# Patient Record
Sex: Male | Born: 1970 | Race: White | Hispanic: No | Smoking: Never smoker
Health system: Southern US, Community
[De-identification: ages and names within clinical notes are randomized; demographics above are authoritative.]

## PROBLEM LIST (undated history)

## (undated) DIAGNOSIS — B001 Herpesviral vesicular dermatitis: Secondary | ICD-10-CM

## (undated) DIAGNOSIS — Z6841 Body Mass Index (BMI) 40.0 and over, adult: Principal | ICD-10-CM

## (undated) HISTORY — DX: Herpesviral vesicular dermatitis: B00.1

## (undated) HISTORY — DX: Body Mass Index (BMI) 40.0 and over, adult: Z684

## (undated) HISTORY — DX: Morbid (severe) obesity due to excess calories: E66.01

---

## 2008-04-01 ENCOUNTER — Emergency Department: Payer: Self-pay | Admitting: Internal Medicine

## 2015-06-05 ENCOUNTER — Ambulatory Visit (INDEPENDENT_AMBULATORY_CARE_PROVIDER_SITE_OTHER): Payer: Managed Care, Other (non HMO) | Admitting: Primary Care

## 2015-06-05 ENCOUNTER — Encounter: Payer: Self-pay | Admitting: Primary Care

## 2015-06-05 VITALS — BP 122/72 | HR 87 | Temp 97.7°F | Ht 73.5 in | Wt 387.4 lb

## 2015-06-05 DIAGNOSIS — Z23 Encounter for immunization: Secondary | ICD-10-CM | POA: Diagnosis not present

## 2015-06-05 DIAGNOSIS — B001 Herpesviral vesicular dermatitis: Secondary | ICD-10-CM | POA: Diagnosis not present

## 2015-06-05 DIAGNOSIS — Z6841 Body Mass Index (BMI) 40.0 and over, adult: Secondary | ICD-10-CM | POA: Diagnosis not present

## 2015-06-05 MED ORDER — VALACYCLOVIR HCL 1 G PO TABS: ORAL_TABLET | ORAL | Status: DC

## 2015-06-05 NOTE — Assessment & Plan Note (Signed)
Present entire life. Recently breakouts have been more frequent (3 episodes in 8 weeks). No improvement with OTC treatment. RX for valtrex sent to pharmacy for outbreaks. Discussed instructions and how to use.

## 2015-06-05 NOTE — Assessment & Plan Note (Addendum)
Struggled with obesity since 2002. Once managed at a bariatric clinic in [place] as he was pursuing surgery. Never went through with surgery as he changed his mind. He is interested now and would like to meet with someone to discuss options. Referral made to bariatric surgical center. Will have him schedule complete physical soon at his convenience.   Discussed the importance of a healthy diet and regular exercise in order for weight loss and to reduce risk of other medical diseases.

## 2015-06-05 NOTE — Progress Notes (Signed)
 Subjective:    Patient ID: Brian Mullins, male    DOB: 05/29/1971, 44 y.o.   MRN: [id number]  HPI  Brian Mullins is a 44 year old male who presents today to establish care and discuss the problems mentioned below. Will obtain old records. Last physical was several years.   1) Foot Pain: Located to the right lateral foot. He describes his pain as a sharp, dull, ache. His pain has been bothering him intermittently for several years, with his most recent episode starting 3 weeks ago. He denies discoloration of skin. His pain is most bothersome when playing golf 3 times weekly, especially when coming through with his swing. He tries to wear comfortable shoes.   2) Cold Sores: Present his entire life. Breakouts once occuring once every seasonal change, but recently has had 3 cold sore breakouts in the past 8 weeks. He will apply peroxide to keep them dry, and has tried Abreva. His cold sores will last about 2 weeks. The Abreva has not helped to reduce the duration of his cold sores.  3) Obesity: Struggled with obesity since 2002. He is interested in gastric bypass surgery. He lived in [place] for several years and was set up with a bariatric center to start the process. He participated in a program to lose weight on his own for 5 months. He took several classes, met with a dietician regularly, and had monthly weigh in's. He did this for 5 consecutive months. He lost 30 pounds through this program. He then changed his mind regarding bariatric surgery and has since moved to [place]. He is now interested in perusing bariatric surgery and would like to meet with someone to discuss his options. He's not had a routine physical in years.  Body mass index is 50.41 kg/(m^2).   Review of Systems  Constitutional: Negative for unexpected weight change.  HENT: Negative for rhinorrhea.   Respiratory: Negative for cough and shortness of breath.   Cardiovascular: Negative for chest pain.  Gastrointestinal: Negative  for diarrhea and constipation.  Genitourinary: Negative for difficulty urinating.  Musculoskeletal:       Chronic knee pain, right- injury in high school. Also right foot pain.  Skin: Negative for rash.  Allergic/Immunologic: Positive for environmental allergies.  Neurological: Negative for dizziness, numbness and headaches.  Psychiatric/Behavioral:       Denies concerns for anxiety or depression       History reviewed. No pertinent past medical history.  Social History   Social History  . Marital Status: Divorced    Spouse Name: N/A  . Number of Children: N/A  . Years of Education: N/A   Occupational History  . Not on file.   Social History Main Topics  . Smoking status: Never Smoker   . Smokeless tobacco: Not on file  . Alcohol Use: 0.0 oz/week    0 Standard drinks or equivalent per week     Comment: social  . Drug Use: Not on file  . Sexual Activity: Not on file   Other Topics Concern  . Not on file   Social History Narrative   Divorced.   2 children.    Works as an executive.   Enjoys golfing, spending time with family.      History reviewed. No pertinent past surgical history.  History reviewed. No pertinent family history.  No Known Allergies  No current outpatient prescriptions on file prior to visit.   No current facility-administered medications on file prior to visit.      BP 122/72 mmHg  Pulse 87  Temp(Src) 97.7 F (36.5 C) (Oral)  Ht 6' 1.5" (1.867 m)  Wt 387 lb 6.4 oz (175.723 kg)  BMI 50.41 kg/m2  SpO2 98%    Objective:   Physical Exam  Constitutional: He is oriented to person, place, and time. He appears well-nourished.  HENT:  Mouth/Throat: Oropharynx is clear and moist.  Neck: Neck supple.  Cardiovascular: Normal rate and regular rhythm.   Pulmonary/Chest: Effort normal and breath sounds normal.  Neurological: He is alert and oriented to person, place, and time.  Skin: Skin is warm and dry.  Psychiatric: He has a normal mood  and affect.          Assessment & Plan:  Foot pain:  Located to right lateral side of foot.  Obvious callusing present to right lateral side from shoe. Could be plantar fasciitis, instructions provided for home treatment. Also discussed weight loss to reduce load of weight on foot, as well as comfortable shoes. Will continue to monitor.

## 2015-06-05 NOTE — Progress Notes (Signed)
Pre visit review using our clinic review tool, if applicable. No additional management support is needed unless otherwise documented below in the visit note. 

## 2015-06-05 NOTE — Addendum Note (Signed)
Addended by: Tawnya CrookSAMBATH, Nazim Kadlec on: 06/05/2015 09:44 AM   Modules accepted: Orders

## 2015-06-05 NOTE — Patient Instructions (Signed)
Cold Sores: I've sent in Valacyclovir tablets for recurrent cold sores. When you experience a breakout: Take 2 tablets by mouth every 12 hours for 1 day.  You will be contacted regarding your referral to the [facility] Center.  Please let us know if you have not heard back within one week.   Please schedule a physical with me within the next 3 months. You may also schedule a lab only appointment 3-4 days prior. We will discuss your lab results in detail during your physical.  It was a pleasure to meet you today! Please don't hesitate to call me with any questions. Welcome to [facility]!  Plantar Fasciitis Plantar fasciitis is a painful foot condition that affects the heel. It occurs when the band of tissue that connects the toes to the heel bone (plantar fascia) becomes irritated. This can happen after exercising too much or doing other repetitive activities (overuse injury). The pain from plantar fasciitis can range from mild irritation to severe pain that makes it difficult for you to walk or move. The pain is usually worse in the morning or after you have been sitting or lying down for a while. CAUSES This condition may be caused by:  Standing for long periods of time.  Wearing shoes that do not fit.  Doing high-impact activities, including running, aerobics, and ballet.  Being overweight.  Having an abnormal way of walking (gait).  Having tight calf muscles.  Having high arches in your feet.  Starting a new athletic activity. SYMPTOMS The main symptom of this condition is heel pain. Other symptoms include:  Pain that gets worse after activity or exercise.  Pain that is worse in the morning or after resting.  Pain that goes away after you walk for a few minutes. DIAGNOSIS This condition may be diagnosed based on your signs and symptoms. Your health care provider will also do a physical exam to check for:  A tender area on the bottom of your foot.  A high arch in your  foot.  Pain when you move your foot.  Difficulty moving your foot. You may also need to have imaging studies to confirm the diagnosis. These can include:  X-rays.  Ultrasound.  MRI. TREATMENT  Treatment for plantar fasciitis depends on the severity of the condition. Your treatment may include:  Rest, ice, and over-the-counter pain medicines to manage your pain.  Exercises to stretch your calves and your plantar fascia.  A splint that holds your foot in a stretched, upward position while you sleep (night splint).  Physical therapy to relieve symptoms and prevent problems in the future.  Cortisone injections to relieve severe pain.  Extracorporeal shock wave therapy (ESWT) to stimulate damaged plantar fascia with electrical impulses. It is often used as a last resort before surgery.  Surgery, if other treatments have not worked after 12 months. HOME CARE INSTRUCTIONS  Take medicines only as directed by your health care provider.  Avoid activities that cause pain.  Roll the bottom of your foot over a bag of ice or a bottle of cold water. Do this for 20 minutes, 3-4 times a day.  Perform simple stretches as directed by your health care provider.  Try wearing athletic shoes with air-sole or gel-sole cushions or soft shoe inserts.  Wear a night splint while sleeping, if directed by your health care provider.  Keep all follow-up appointments with your health care provider. PREVENTION   Do not perform exercises or activities that cause heel pain.  Consider  finding low-impact activities if you continue to have problems.  Lose weight if you need to. The best way to prevent plantar fasciitis is to avoid the activities that aggravate your plantar fascia. SEEK MEDICAL CARE IF:  Your symptoms do not go away after treatment with home care measures.  Your pain gets worse.  Your pain affects your ability to move or do your daily activities.   This information is not intended  to replace advice given to you by your health care provider. Make sure you discuss any questions you have with your health care provider.   Document Released: 02/25/2001 Document Revised: 02/21/2015 Document Reviewed: 04/12/2014 Elsevier Interactive Patient Education [employer]2016 Elsevier Inc.

## 2015-07-05 ENCOUNTER — Other Ambulatory Visit: Payer: Self-pay | Admitting: Primary Care

## 2015-07-05 DIAGNOSIS — Z0000 Encounter for general adult medical examination without abnormal findings: Secondary | ICD-10-CM

## 2015-07-11 ENCOUNTER — Other Ambulatory Visit: Payer: Managed Care, Other (non HMO)

## 2015-07-16 ENCOUNTER — Encounter: Payer: Managed Care, Other (non HMO) | Admitting: Primary Care

## 2015-07-16 ENCOUNTER — Telehealth: Payer: Self-pay | Admitting: Primary Care

## 2015-07-16 NOTE — Telephone Encounter (Signed)
Yes, please reschedule at his convenience. He will need to schedule labs as well. Please do not charge no show fee. Thanks!

## 2015-07-16 NOTE — Telephone Encounter (Signed)
Pt did not come in for their appt today for cpe. Please let me know if pt needs to be contacted immediately for follow up or no follow up needed. Best phone number to contact pt is [phone].

## 2015-07-17 ENCOUNTER — Telehealth: Payer: Self-pay

## 2015-07-17 NOTE — Telephone Encounter (Signed)
Pt left v/m; pt is out of town and request few pills to last pt until returns home on 07/19/15. Left v/m requesting pt to cb. Need to know name of med, quantity needed and pharmacy to send med to.

## 2015-07-23 NOTE — Telephone Encounter (Signed)
Left voicemail for patient to call back and reschedule appt. °

## 2015-08-15 NOTE — Telephone Encounter (Signed)
Unable to reach pt by phone letter mailed.  

## 2015-11-02 ENCOUNTER — Ambulatory Visit: Payer: Managed Care, Other (non HMO) | Admitting: Primary Care

## 2015-11-05 ENCOUNTER — Ambulatory Visit (INDEPENDENT_AMBULATORY_CARE_PROVIDER_SITE_OTHER): Payer: Managed Care, Other (non HMO) | Admitting: Primary Care

## 2015-11-05 ENCOUNTER — Encounter: Payer: Self-pay | Admitting: Primary Care

## 2015-11-05 ENCOUNTER — Ambulatory Visit (INDEPENDENT_AMBULATORY_CARE_PROVIDER_SITE_OTHER)
Admission: RE | Admit: 2015-11-05 | Discharge: 2015-11-05 | Disposition: A | Discharge status: Home / Self Care | Payer: Managed Care, Other (non HMO) | Source: Ambulatory Visit | Attending: Primary Care | Admitting: Primary Care

## 2015-11-05 VITALS — BP 122/82 | HR 87 | Temp 98.1°F | Ht 73.5 in | Wt 380.4 lb

## 2015-11-05 DIAGNOSIS — M25561 Pain in right knee: Secondary | ICD-10-CM | POA: Diagnosis not present

## 2015-11-05 DIAGNOSIS — Z6841 Body Mass Index (BMI) 40.0 and over, adult: Secondary | ICD-10-CM

## 2015-11-05 NOTE — Assessment & Plan Note (Signed)
Chronic for years, worse over past 2 months. Discussed various causes and treatment. His is likely cause by deterioration of cartilage, however, given location of pain to left medical collateral ligament, it is reasonable to examine with xray, and perhaps MRI. Will consider PT if necessary.  Discussed importance of weight loss to reduce pain. Ice, obtain knee brace, continue temporary Ibuprofen PRN.

## 2015-11-05 NOTE — Progress Notes (Signed)
Pre visit review using our clinic review tool, if applicable. No additional management support is needed unless otherwise documented below in the visit note. 

## 2015-11-05 NOTE — Progress Notes (Signed)
   Subjective:    Patient ID: Brian Mullins, male    DOB: [birth year], 45 y.o.   MRN: [id number]  HPI  Mr. Brian Mullins is a 45 year old male who presents today with a chief complaint of knee pain. His pain is located to the right knee on over the medial collateral ligament. His knee pain is chronic from an old injury, but over the past 2 months his pain has become more bothersome.   His pain is worse after working out if he works out in the morning. If he does not work out then his pain will develop around noon. He's been taking Advil 1200-1600 mg daily with improvement in discomfort.    He's undergone evaluation numerous years ago and was told he had damage to the \ cartildege in the knee. He's not undergone imaging recently. Denies feelings of "giving way", recent trauma, numbness/tinlging.  He's not tried icing his knee or wearing a knee brace. He was went through PT 15 years ago and believes it helped.     Review of Systems  Cardiovascular: Negative for leg swelling.  Musculoskeletal:       Right knee pain  Skin: Negative for color change.  Neurological: Negative for numbness.       Past Medical History  Diagnosis Date  . Morbid obesity with BMI of 50.0-59.9, adult (HCC)   . Recurrent cold sores      Social History   Social History  . Marital Status: Divorced    Spouse Name: N/A  . Number of Children: N/A  . Years of Education: N/A   Occupational History  . Not on file.   Social History Main Topics  . Smoking status: Never Smoker   . Smokeless tobacco: Not on file  . Alcohol Use: 0.0 oz/week    0 Standard drinks or equivalent per week     Comment: social  . Drug Use: Not on file  . Sexual Activity: Not on file   Other Topics Concern  . Not on file   Social History Narrative   Divorced.   2 children.    Works as an Psychologist, educationalexecutive.   Enjoys golfing, spending time with family.      No past surgical history on file.  No family history on file.  No Known  Allergies  Current Outpatient Prescriptions on File Prior to Visit  Medication Sig Dispense Refill  . valACYclovir (VALTREX) 1000 MG tablet Take 2 tablets by mouth twice daily for 1 day for cold sore breakouts. 30 tablet 0   No current facility-administered medications on file prior to visit.    BP 122/82 mmHg  Pulse 87  Temp(Src) 98.1 F (36.7 C) (Oral)  Ht 6' 1.5" (1.867 m)  Wt 380 lb 6.4 oz (172.548 kg)  BMI 49.50 kg/m2  SpO2 97%    Objective:   Physical Exam  Constitutional: He appears well-nourished.  Cardiovascular: Normal rate and regular rhythm.   Pulmonary/Chest: Effort normal and breath sounds normal.  Musculoskeletal:       Right knee: He exhibits decreased range of motion. He exhibits no swelling, no deformity and normal alignment. No medial joint line tenderness noted.          Assessment & Plan:

## 2015-11-05 NOTE — Patient Instructions (Signed)
Complete xray(s) prior to leaving today. I will notify you of your results once received.  Continue Advil as needed for pain and inflammation. Do not exceed 2400 mg of Advil in 24 hours.  Ice your knee three times daily for 20 minute intervals.   Apply a knee sleeve for support during the day.  I'll be in touch soon.  It was a pleasure to see you today!  Knee Pain Knee pain is a very common symptom and can have many causes. Knee pain often goes away when you follow your health care provider's instructions for relieving pain and discomfort at home. However, knee pain can develop into a condition that needs treatment. Some conditions may include:  Arthritis caused by wear and tear (osteoarthritis).  Arthritis caused by swelling and irritation (rheumatoid arthritis or gout).  A cyst or growth in your knee.  An infection in your knee joint.  An injury that will not heal.  Damage, swelling, or irritation of the tissues that support your knee (torn ligaments or tendinitis). If your knee pain continues, additional tests may be ordered to diagnose your condition. Tests may include X-rays or other imaging studies of your knee. You may also need to have fluid removed from your knee. Treatment for ongoing knee pain depends on the cause, but treatment may include:  Medicines to relieve pain or swelling.  Steroid injections in your knee.  Physical therapy.  Surgery. HOME CARE INSTRUCTIONS  Take medicines only as directed by your health care provider.  Rest your knee and keep it raised (elevated) while you are resting.  Do not do things that cause or worsen pain.  Avoid high-impact activities or exercises, such as running, jumping rope, or doing jumping jacks.  Apply ice to the knee area:  Put ice in a plastic bag.  Place a towel between your skin and the bag.  Leave the ice on for 20 minutes, 2-3 times a day.  Ask your health care provider if you should wear an elastic knee  support.  Keep a pillow under your knee when you sleep.  Lose weight if you are overweight. Extra weight can put pressure on your knee.  Do not use any tobacco products, including cigarettes, chewing tobacco, or electronic cigarettes. If you need help quitting, ask your health care provider. Smoking may slow the healing of any bone and joint problems that you may have. SEEK MEDICAL CARE IF:  Your knee pain continues, changes, or gets worse.  You have a fever along with knee pain.  Your knee buckles or locks up.  Your knee becomes more swollen. SEEK IMMEDIATE MEDICAL CARE IF:   Your knee joint feels hot to the touch.  You have chest pain or trouble breathing.   This information is not intended to replace advice given to you by your health care provider. Make sure you discuss any questions you have with your health care provider.   Document Released: 03/30/2007 Document Revised: 06/23/2014 Document Reviewed: 01/16/2014 Elsevier Interactive Patient Education [employer]2016 Elsevier Inc.

## 2015-11-05 NOTE — Assessment & Plan Note (Signed)
Has tried working out, but now has knee pain. He is interested in a referral to nutritionist for more education regarding weight loss. Referral placed.

## 2015-11-06 ENCOUNTER — Other Ambulatory Visit: Payer: Self-pay | Admitting: Primary Care

## 2015-11-06 DIAGNOSIS — M25561 Pain in right knee: Secondary | ICD-10-CM

## 2015-11-28 ENCOUNTER — Other Ambulatory Visit: Payer: Self-pay | Admitting: Primary Care

## 2015-11-28 DIAGNOSIS — B001 Herpesviral vesicular dermatitis: Secondary | ICD-10-CM

## 2015-11-28 NOTE — Telephone Encounter (Signed)
Electronically refill request for   valACYclovir (VALTREX) 1000 MG tablet   Take 2 tablets by mouth twice daily for 1 day for cold sore breakouts.  Dispense: 30 tablet   Refills: 0     Last prescribed on 06/05/2015. Last seen on 11/05/2015. No future appt.

## 2015-12-12 ENCOUNTER — Ambulatory Visit: Payer: Managed Care, Other (non HMO) | Admitting: Dietician

## 2015-12-25 ENCOUNTER — Telehealth: Payer: Self-pay

## 2015-12-25 DIAGNOSIS — B001 Herpesviral vesicular dermatitis: Secondary | ICD-10-CM

## 2015-12-25 MED ORDER — VALACYCLOVIR HCL 500 MG PO TABS: 500.0000 mg | ORAL_TABLET | Freq: Every day | ORAL | Status: DC

## 2015-12-25 NOTE — Telephone Encounter (Signed)
Please notify patient that he can take Valtrex 500 mg once daily every day for prevention of cold sores. I have sent a new prescription to his pharmacy. Please have him notify me if this does or does not help to prevent cold sore breakouts. If this helps please have him notify me and I will send a years worth of refills.

## 2015-12-25 NOTE — Telephone Encounter (Signed)
Pt left v/m; pt has had outbreak of cold sores x 3 in 3 months; pt has valtrex 1000 mg; when pt doubles the med it seems to help some but does not eliminate cold sores quickly pt wants stronger med that can take daily to prevent outbreaks of cold sores. Pt request cb. Pt established care12/20/16. Pt request cb.[facility]. No future appt scheduled.

## 2015-12-25 NOTE — Telephone Encounter (Signed)
Spoken and notified patient of Kate's comments. Patient verbalized understanding. 

## 2016-04-08 ENCOUNTER — Other Ambulatory Visit: Payer: Self-pay | Admitting: Primary Care

## 2016-04-08 DIAGNOSIS — B001 Herpesviral vesicular dermatitis: Secondary | ICD-10-CM

## 2016-07-02 ENCOUNTER — Other Ambulatory Visit: Payer: Self-pay | Admitting: Primary Care

## 2016-07-02 DIAGNOSIS — B001 Herpesviral vesicular dermatitis: Secondary | ICD-10-CM

## 2016-07-04 NOTE — Telephone Encounter (Signed)
Ok to refill? Electronically refill request for   valACYclovir (VALTREX) 500 MG tablet  Last prescribed on 04/09/2016. Last seen on 11/05/2015.

## 2016-08-19 ENCOUNTER — Telehealth: Payer: Self-pay

## 2016-08-19 NOTE — Telephone Encounter (Signed)
Spoken and notified patient of Kate's comments. Patient verbalized understanding. 

## 2016-08-19 NOTE — Telephone Encounter (Signed)
Pt started on 08/18/16 with prod cough with clear to green phlegm; congestion, H/A and chills; no fever yet.pt [facility].

## 2016-08-19 NOTE — Telephone Encounter (Signed)
Please notify patient that we are sorry he's not feeling well. These symptoms are representative of a viral illness, but doesn't mean that he has the flu. We are happy to see him in the clinic for testing if he chooses, otherwise he can try a few things over the counter. Tamiflu has lots of side effects, so if it's not the flu then he could potentially feel worse.  Cough/Congestion: Try taking Mucinex DM. This will help loosen up the mucous in your chest. Ensure you take this medication with a full glass of water.  Headache/Chills: Ibuprofen 600 mg three times daily or Tylenol 650 mg three times daily.  Nasal Congestion/Ear Pressure/Sinus Pressure: Try using Flonase (fluticasone) nasal spray. Instill 1 spray in each nostril twice daily.

## 2016-10-06 ENCOUNTER — Other Ambulatory Visit: Payer: Self-pay

## 2016-10-06 DIAGNOSIS — B001 Herpesviral vesicular dermatitis: Secondary | ICD-10-CM

## 2016-10-06 NOTE — Telephone Encounter (Signed)
How long has he taken the valtrex daily? 6+ months? Shorter? Longer? Has he tried stopping the daily treatment to see if cold sores return. Generally we treat for about 6 months then stop to see if the cold sores stay controlled.  Let me know. Also due for office visit in May if we are to continue filling his medications. Would like to do CPE.

## 2016-10-06 NOTE — Telephone Encounter (Signed)
Pt left v/m requesting refill valacyclovir to [facility]. Last refilled # 90 on 07/04/16. Pt takes med to prevent cold sores on mouth. Pt last seen [facility] 11/05/15.Please advise.

## 2016-10-07 ENCOUNTER — Other Ambulatory Visit: Payer: Self-pay | Admitting: Primary Care

## 2016-10-07 DIAGNOSIS — B001 Herpesviral vesicular dermatitis: Secondary | ICD-10-CM

## 2016-10-07 NOTE — Telephone Encounter (Signed)
Ok to refill? Electronically refill request for valACYclovir (VALTREX) 500 MG tablet. Last prescribed on 07/04/2016. Last seen on 11/05/2015  Patient is taking Valtrex 500 mg once daily every day for prevention of cold sores.

## 2016-10-07 NOTE — Telephone Encounter (Signed)
How long has he been taking this for cold sore prevention? Typically it's not meant to be taken daily for more than 3-6 months at a time. If he's been taking this for that long of time then I recommend we trial him off of daily medication to see if his cold sores are well controlled.

## 2016-10-08 MED ORDER — VALACYCLOVIR HCL 500 MG PO TABS: 500.0000 mg | ORAL_TABLET | Freq: Every day | ORAL | 0 refills | Status: DC

## 2016-10-08 NOTE — Telephone Encounter (Signed)
Spoke with patient. Overall cold sores are mostly controlled on the daily Valtrex dose, he has experienced 3 outbreaks in three months on Valtrex. Discussed that safety and efficacy has not been done on long term daily doses of valtrex greater than 1 year. Will extend his prescription for another three months and check back in at that point. He agreed and verbalized understanding.

## 2016-10-08 NOTE — Telephone Encounter (Signed)
Pt left v/m; pt has been taking valacyclovir for 6 months; pt has cold sores on and off and pt thinks due to stress. Pt request cb with what to do next.

## 2016-10-08 NOTE — Telephone Encounter (Signed)
Per DPR, left detail message of Kate's comments for patient to call back. 

## 2016-10-09 NOTE — Telephone Encounter (Signed)
This was taken call on the previous telephone note.

## 2017-01-23 IMAGING — DX DG KNEE AP/LAT W/ SUNRISE*R*
4 series · 4 of 4 positions shown · non-contrast
Comparison: None.

CLINICAL DATA: Morbid obesity. Chronic right knee pain worse over
the last 2 months.

EXAM:
RIGHT KNEE 3 VIEWS

[knee ap]
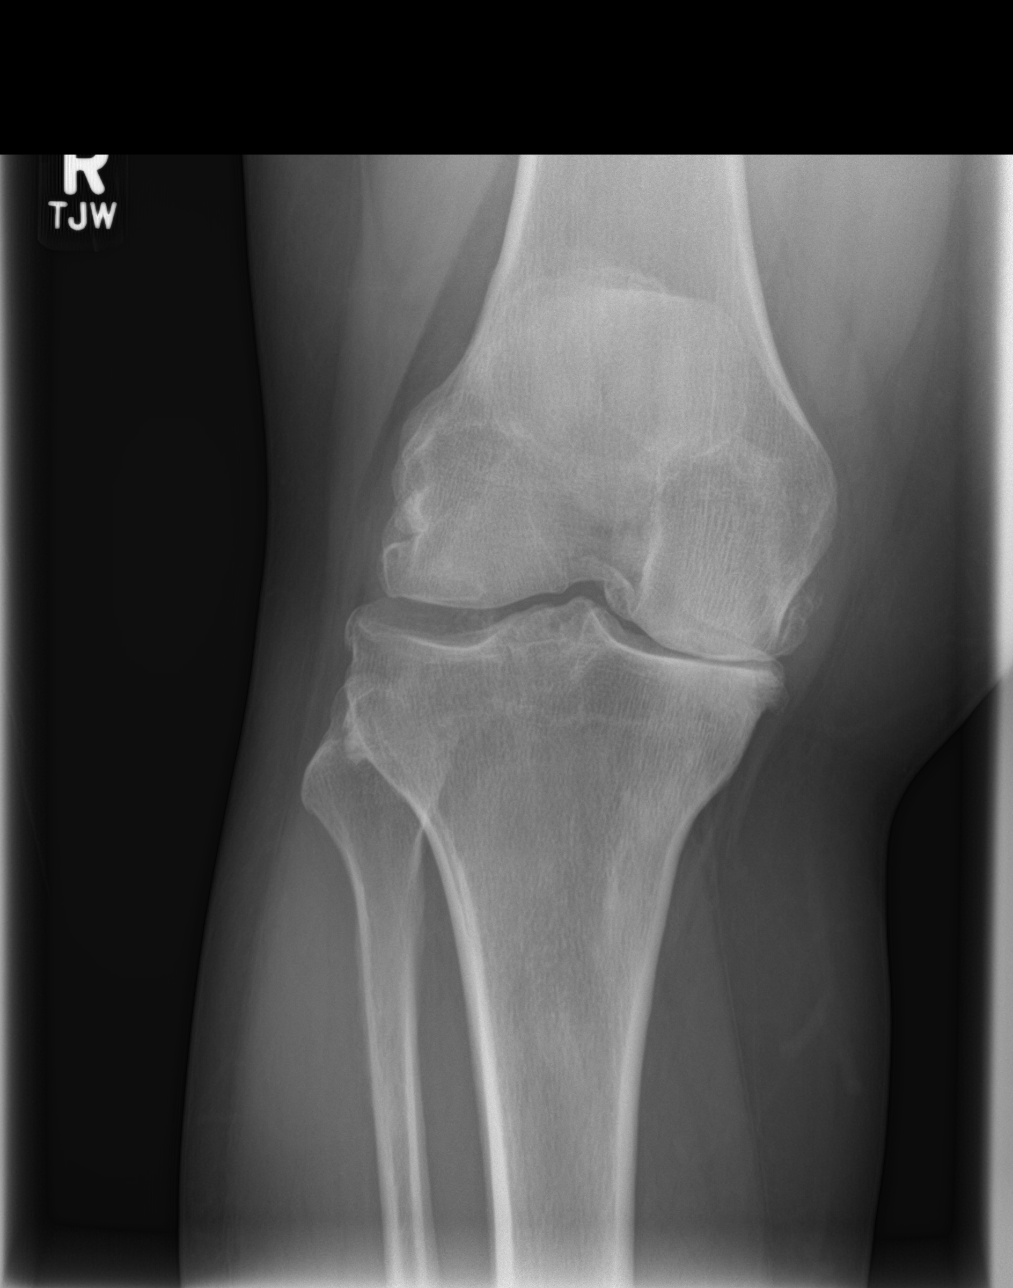

[knee lat]
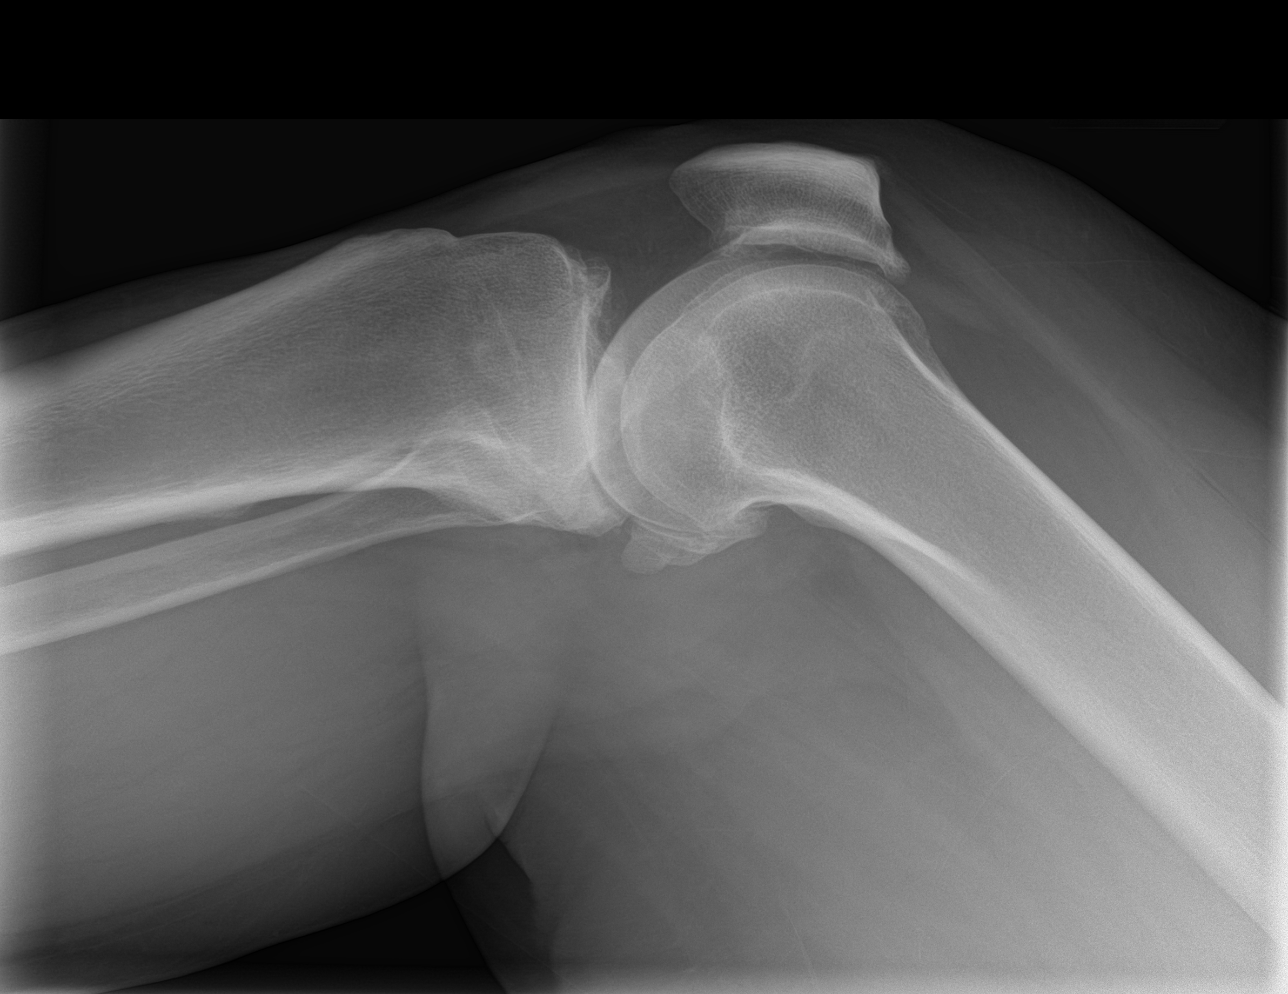

[patella skyline]
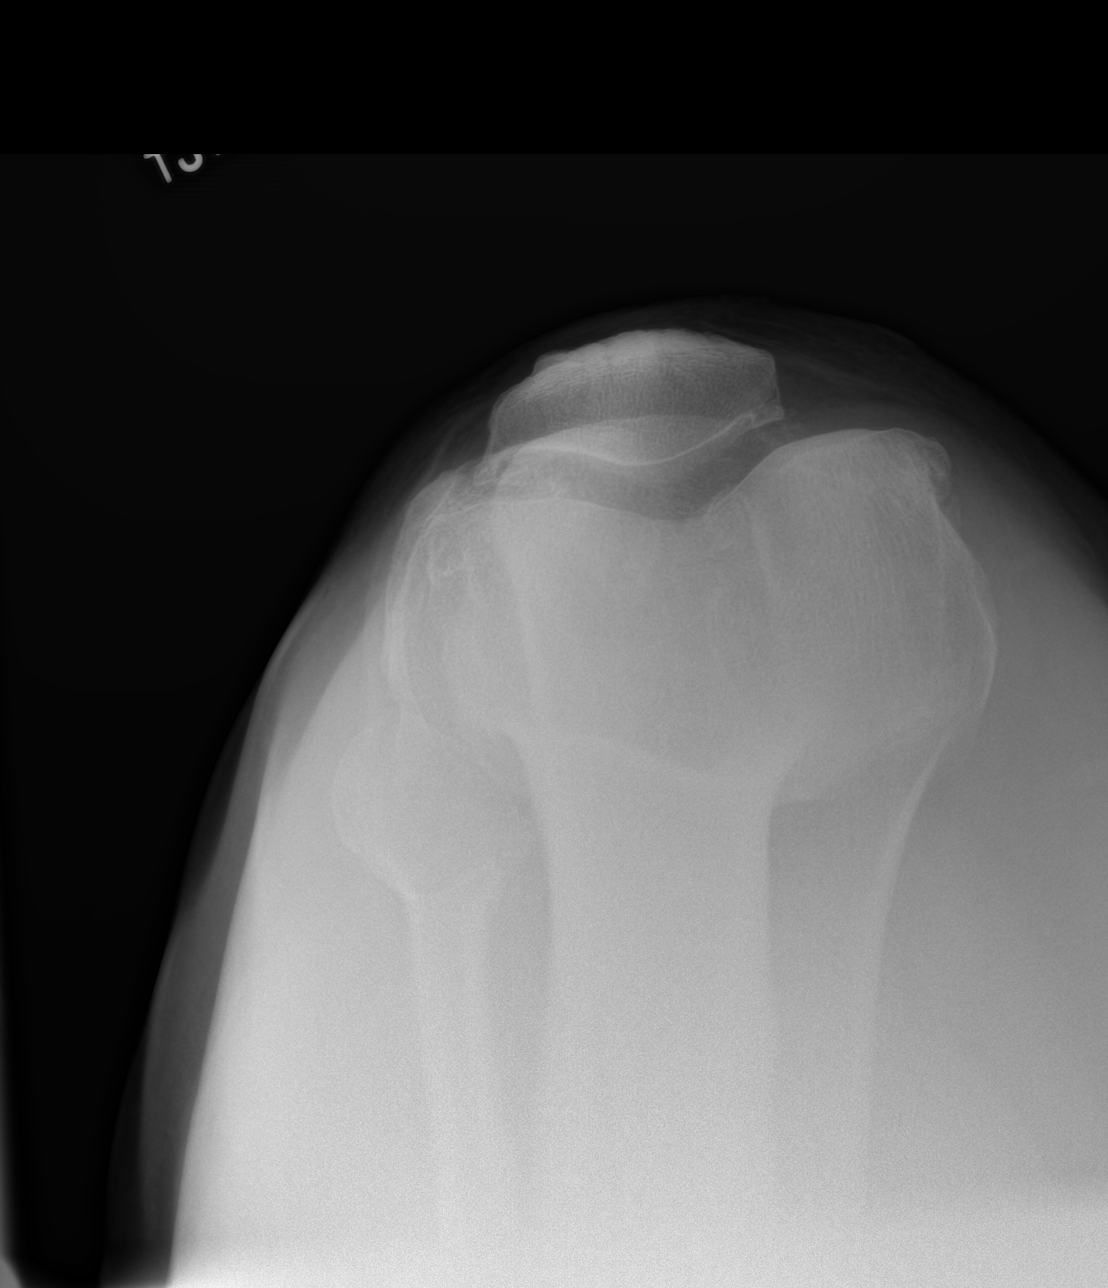

[knee obl]
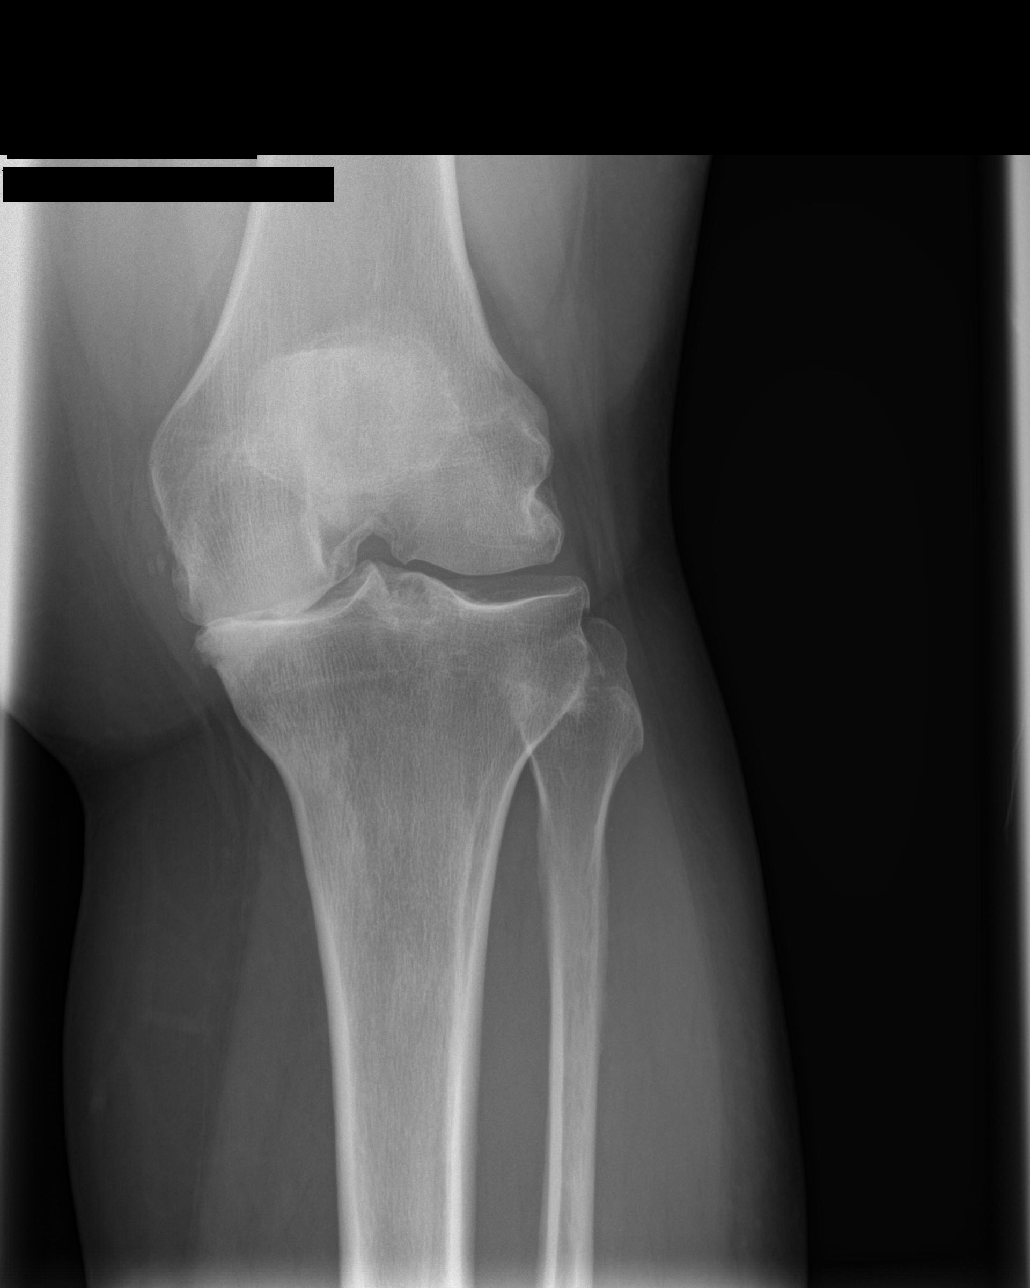

[4 of 4 positions shown; findings below may reference images not displayed]

FINDINGS: Advanced degenerative joint disease changes, most pronounced in the
medial compartment. Joint space narrowing and spurring diffusely.
Trace joint effusion. No acute bony abnormality. Specifically, no
fracture, subluxation, or dislocation. Soft tissues are intact.
IMPRESSION: Advanced degenerative changes, most pronounced in the medial
compartment. No acute bony abnormality.

## 2017-08-19 ENCOUNTER — Other Ambulatory Visit: Payer: Self-pay | Admitting: Primary Care

## 2017-08-19 DIAGNOSIS — B001 Herpesviral vesicular dermatitis: Secondary | ICD-10-CM

## 2017-09-01 ENCOUNTER — Telehealth: Payer: Self-pay | Admitting: Primary Care

## 2017-09-01 NOTE — Telephone Encounter (Signed)
Patient will need an appointment as noted by Jerelene ReddenKatie Clark, AGNP-C before filling. See encounter for refill 08/19/17.

## 2017-09-01 NOTE — Telephone Encounter (Signed)
Copied from CRM [id number]. Topic: Quick Communication - Rx Refill/Question >> Sep 01, 2017 12:41 PM Arlyss Gandyichardson, Delaila Nand N, NT wrote: Medication:  valACYclovir (VALTREX) 500    Has the patient contacted their pharmacy? Yes.     (Agent: If no, request that the patient contact the pharmacy for the refill.)   Preferred Pharmacy (with phone number or street name): [facility] [place]   Agent: Please be advised that RX refills may take up to 3 business days. We ask that you follow-up with your pharmacy.

## 2017-09-02 NOTE — Telephone Encounter (Signed)
Pt called back and scheduled an appt for 09/04/17

## 2017-09-02 NOTE — Telephone Encounter (Signed)
Pt called to schedule an appointment for refills. No answer. Message left for him to call and make an appointment.

## 2017-09-04 ENCOUNTER — Encounter: Payer: Self-pay | Admitting: Primary Care

## 2017-09-04 ENCOUNTER — Ambulatory Visit (INDEPENDENT_AMBULATORY_CARE_PROVIDER_SITE_OTHER): Payer: BLUE CROSS/BLUE SHIELD | Admitting: Primary Care

## 2017-09-04 VITALS — BP 122/84 | HR 89 | Temp 98.1°F | Wt 372.0 lb

## 2017-09-04 DIAGNOSIS — B001 Herpesviral vesicular dermatitis: Secondary | ICD-10-CM | POA: Diagnosis not present

## 2017-09-04 LAB — COMPREHENSIVE METABOLIC PANEL
ALK PHOS: 56 U/L (ref 39–117)
ALT: 20 U/L (ref 0–53)
AST: 13 U/L (ref 0–37)
Albumin: 4.4 g/dL (ref 3.5–5.2)
BILIRUBIN TOTAL: 1.5 mg/dL — AB (ref 0.2–1.2)
BUN: 15 mg/dL (ref 6–23)
CO2: 26 mEq/L (ref 19–32)
CREATININE: 0.74 mg/dL (ref 0.40–1.50)
Calcium: 9.4 mg/dL (ref 8.4–10.5)
Chloride: 103 mEq/L (ref 96–112)
GFR: 120.99 mL/min (ref >60.00)
GLUCOSE: 110 mg/dL — AB (ref 70–99)
Potassium: 4.3 mEq/L (ref 3.5–5.1)
Sodium: 138 mEq/L (ref 135–145)
TOTAL PROTEIN: 7.1 g/dL (ref 6.0–8.3)

## 2017-09-04 LAB — CBC
HCT: 42.2 % (ref 39.0–52.0)
Hemoglobin: 14.4 g/dL (ref 13.0–17.0)
MCHC: 34.1 g/dL (ref 30.0–36.0)
MCV: 85.3 fl (ref 78.0–100.0)
Platelets: 231 10*3/uL (ref 150.0–400.0)
RBC: 4.95 Mil/uL (ref 4.22–5.81)
RDW: 12.9 % (ref 11.5–15.5)
WBC: 10 10*3/uL (ref 4.0–10.5)

## 2017-09-04 MED ORDER — VALACYCLOVIR HCL 500 MG PO TABS: ORAL_TABLET | ORAL | 0 refills | Status: DC

## 2017-09-04 NOTE — Progress Notes (Signed)
Subjective:    Patient ID: Brian Mullins, male    DOB: [birth year], 47 y.o.   MRN: [id number]  HPI  Mr. Brian Mullins is a 47 year old male who presents today for medication refill.  1) Recurrent Cold Sores: Currently managed on Valtrex for which he once took daily for recurrent sores for about 3 months. He was then switched to an as needed dose to see if cold sore re-occurred. He's been out of his medication for 1-2 months. Cold sores are occuring 1-2 times monthly on average with PRN dosing. He thinks he had one cold sore outbreak during his consistent use of Valtrex.   Review of Systems  HENT:       Recurrent cold sores  Respiratory: Negative for shortness of breath.   Cardiovascular: Negative for chest pain.  Gastrointestinal: Negative for abdominal pain.  Neurological: Negative for dizziness and headaches.        Past Medical History:  Diagnosis Date  . Morbid obesity with BMI of 50.0-59.9, adult (HCC)   . Recurrent cold sores      Social History   Socioeconomic History  . Marital status: Divorced    Spouse name: Not on file  . Number of children: Not on file  . Years of education: Not on file  . Highest education level: Not on file  Occupational History  . Not on file  Social Needs  . Financial resource strain: Not on file  . Food insecurity:    Worry: Not on file    Inability: Not on file  . Transportation needs:    Medical: Not on file    Non-medical: Not on file  Tobacco Use  . Smoking status: Never Smoker  . Smokeless tobacco: Never Used  Substance and Sexual Activity  . Alcohol use: Yes    Alcohol/week: 0.0 oz    Comment: social  . Drug use: Not on file  . Sexual activity: Not on file  Lifestyle  . Physical activity:    Days per week: Not on file    Minutes per session: Not on file  . Stress: Not on file  Relationships  . Social connections:    Talks on phone: Not on file    Gets together: Not on file    Attends religious service: Not on file      Active member of club or organization: Not on file    Attends meetings of clubs or organizations: Not on file    Relationship status: Not on file  . Intimate partner violence:    Fear of current or ex partner: Not on file    Emotionally abused: Not on file    Physically abused: Not on file    Forced sexual activity: Not on file  Other Topics Concern  . Not on file  Social History Narrative   Divorced.   2 children.    Works as an Psychologist, educational.   Enjoys golfing, spending time with family.      No past surgical history on file.  No family history on file.  No Known Allergies  No current outpatient medications on file prior to visit.   No current facility-administered medications on file prior to visit.     BP 122/84   Pulse 89   Temp 98.1 F (36.7 C) (Oral)   Wt (!) 372 lb (168.7 kg)   SpO2 100%   BMI 48.41 kg/m    Objective:   Physical Exam  Constitutional: He is oriented to person,  place, and time. He appears well-nourished.  Neck: Neck supple.  Cardiovascular: Normal rate and regular rhythm.  Pulmonary/Chest: Effort normal and breath sounds normal. He has no wheezes. He has no rales.  Neurological: He is alert and oriented to person, place, and time.  Skin:  2 small crusted lesions to left upper lip  Psychiatric: He has a normal mood and affect.          Assessment & Plan:

## 2017-09-04 NOTE — Assessment & Plan Note (Addendum)
Will resume Valtrex at once daily dosing x 6 months, then as a PRN dose. Will also treat current outbreak with appropriate dose. He will update if outbreaks reoccur after continuous 6 month dosing.  Check CBC and CMP for monitoring.

## 2017-09-04 NOTE — Patient Instructions (Signed)
Start Valtrex. Take 4 tablets twice daily for one day, then take 1 tablet by mouth once daily thereafter.  Stop by the lab prior to leaving today. I will notify you of your results once received.   Continue daily use of Valtrex for 6 months, then use as needed. Please update me if you continue to experience cold sore outbreaks.  It was a pleasure to see you today!

## 2017-09-07 ENCOUNTER — Other Ambulatory Visit (INDEPENDENT_AMBULATORY_CARE_PROVIDER_SITE_OTHER): Payer: BLUE CROSS/BLUE SHIELD

## 2017-09-07 ENCOUNTER — Other Ambulatory Visit: Payer: Self-pay | Admitting: Primary Care

## 2017-09-07 DIAGNOSIS — R7309 Other abnormal glucose: Secondary | ICD-10-CM

## 2017-09-07 DIAGNOSIS — R7303 Prediabetes: Secondary | ICD-10-CM

## 2017-09-07 LAB — HEMOGLOBIN A1C: Hgb A1c MFr Bld: 6.2 % (ref 4.6–6.5)

## 2017-09-10 ENCOUNTER — Encounter: Payer: Self-pay | Admitting: *Deleted

## 2018-03-09 ENCOUNTER — Other Ambulatory Visit: Payer: Self-pay

## 2018-03-09 DIAGNOSIS — B001 Herpesviral vesicular dermatitis: Secondary | ICD-10-CM

## 2018-03-09 NOTE — Telephone Encounter (Signed)
Pt last seen 09/04/17 for cold sores; Valtrex 500 mg last filled # 98 on 09/04/17. [facility].Please advise.

## 2018-03-09 NOTE — Telephone Encounter (Signed)
Did he take the valtrex daily for cold sore prevent?  When was his last dose? How many cold sores has he had since we saw him in March 2019?

## 2018-03-09 NOTE — Telephone Encounter (Signed)
PLEASE NOTE: All timestamps contained within this report are represented as [place] Standard Time. CONFIDENTIALTY NOTICE: This fax transmission is intended only for the addressee. It contains information that is legally privileged, confidential or otherwise protected from use or disclosure. If you are not the intended recipient, you are strictly prohibited from reviewing, disclosing, copying using or disseminating any of this information or taking any action in reliance on or regarding this information. If you have received this fax in error, please notify us immediately by telephone so that we can arrange for its return to us. Phone: [phone], Toll-Free: [phone], Fax: [phone] Page: 1 of 1 Call Id: [id number] [facility] Night - Client Nonclinical Telephone Record [facility] - Client [facility] [place] Physician Vernona Riegerlark, Katherine - NP Contact Type Call Who Is Calling Patient / Member / Family / Caregiver Caller Name [name] Laduke Caller Phone Number [phone] Patient Name [name] Ethridge Patient DOB 1970/12/19 Call Type Message Only Information Provided Reason for Call Medication Question / Request Initial Comment Caller states he is needing a refill Additional Comment He has a cold sore coming on Call Closed By: Jiles GarterNicole Stranahan Transaction Date/Time: 03/09/2018 4:53:19 AM (ET)

## 2018-03-10 MED ORDER — VALACYCLOVIR HCL 1 G PO TABS: ORAL_TABLET | ORAL | 0 refills | Status: DC

## 2018-03-10 NOTE — Telephone Encounter (Signed)
Noted. Changed Rx for use of PRN cold sores. He is to take 2 tablets twice daily for one day at cold sore onset. This is a one day treatment and only as needed. Have him call me if he starts to notice them more frequently.

## 2018-03-10 NOTE — Telephone Encounter (Signed)
Yes, he is taking it for cold sore prevention. Last dose was in mid-July. He had 2 outbreak since March, this is much better than before.

## 2018-03-19 NOTE — Telephone Encounter (Signed)
Late entry. Per DPR, left detail message of Brian Mullins comments for patient.

## 2018-05-03 ENCOUNTER — Ambulatory Visit (INDEPENDENT_AMBULATORY_CARE_PROVIDER_SITE_OTHER): Payer: 59 | Admitting: Primary Care

## 2018-05-03 ENCOUNTER — Encounter: Payer: Self-pay | Admitting: Primary Care

## 2018-05-03 VITALS — BP 130/82 | HR 99 | Temp 98.8°F | Ht 73.5 in | Wt 388.5 lb

## 2018-05-03 DIAGNOSIS — Z23 Encounter for immunization: Secondary | ICD-10-CM | POA: Diagnosis not present

## 2018-05-03 DIAGNOSIS — L989 Disorder of the skin and subcutaneous tissue, unspecified: Secondary | ICD-10-CM

## 2018-05-03 DIAGNOSIS — B001 Herpesviral vesicular dermatitis: Secondary | ICD-10-CM

## 2018-05-03 MED ORDER — VALACYCLOVIR HCL 500 MG PO TABS: ORAL_TABLET | ORAL | 0 refills | Status: DC

## 2018-05-03 NOTE — Progress Notes (Signed)
Subjective:    Patient ID: Brian Mullins, male    DOB: 21-Dec-1970, 47 y.o.   MRN: [id number]  HPI  Brian Mullins is a 47 year old male who presents today with a chief complaint of scalp mass. He's also requesting a refill of his valacyclovir.   He first noticed the mass 6-7 weeks ago. He has been picking at the mass intermittently which will bleed. He denies pain. The mass has grown gradually since it was first noticed. He does smoke cigars intermittently. He denies a personal history of skin cancer.  He's experiencing cold sores infrequently, 2 outbreaks every 3 months. He stopped taking the valacyclovir daily 6 months ago. He will 1000 in the morning and 500 mg at night when he feels a sore initiate, will take this for 2-3 days.   Review of Systems  Constitutional: Negative for fever.  Skin: Negative for wound.       Skin lesion        Past Medical History:  Diagnosis Date  . Morbid obesity with BMI of 50.0-59.9, adult (HCC)   . Recurrent cold sores      Social History   Socioeconomic History  . Marital status: Divorced    Spouse name: Not on file  . Number of children: Not on file  . Years of education: Not on file  . Highest education level: Not on file  Occupational History  . Not on file  Social Needs  . Financial resource strain: Not on file  . Food insecurity:    Worry: Not on file    Inability: Not on file  . Transportation needs:    Medical: Not on file    Non-medical: Not on file  Tobacco Use  . Smoking status: Never Smoker  . Smokeless tobacco: Never Used  Substance and Sexual Activity  . Alcohol use: Yes    Alcohol/week: 0.0 standard drinks    Comment: social  . Drug use: Not on file  . Sexual activity: Not on file  Lifestyle  . Physical activity:    Days per week: Not on file    Minutes per session: Not on file  . Stress: Not on file  Relationships  . Social connections:    Talks on phone: Not on file    Gets together: Not on file   Attends religious service: Not on file    Active member of club or organization: Not on file    Attends meetings of clubs or organizations: Not on file    Relationship status: Not on file  . Intimate partner violence:    Fear of current or ex partner: Not on file    Emotionally abused: Not on file    Physically abused: Not on file    Forced sexual activity: Not on file  Other Topics Concern  . Not on file  Social History Narrative   Divorced.   2 children.    Works as an Psychologist, educational.   Enjoys golfing, spending time with family.      No past surgical history on file.  No family history on file.  No Known Allergies  No current outpatient medications on file prior to visit.   No current facility-administered medications on file prior to visit.     BP 130/82   Pulse 99   Temp 98.8 F (37.1 C) (Oral)   Ht 6' 1.5" (1.867 m)   Wt (!) 388 lb 8 oz (176.2 kg)   SpO2 97%   BMI  50.56 kg/m    Objective:   Physical Exam  Constitutional: He appears well-nourished.  Cardiovascular: Normal rate.  Respiratory: Effort normal.  Skin: Skin is warm and dry.  0.5 cm, raised, circular, dark red lesion with slight scabbing to left parietal scalp.            Assessment & Plan:

## 2018-05-03 NOTE — Patient Instructions (Signed)
You will be contacted regarding your referral to dermatology.  Please let us know if you have not been contacted within one week.   I sent in valacyclovir 500 mg tablets to the pharmacy. Take 2 tablets twice daily for 1-3 days as needed for outbreaks.   It was a pleasure to see you today!

## 2018-05-03 NOTE — Assessment & Plan Note (Signed)
Less frequent overall. He has difficulty swallowing the 1000 mg tablet, will switch to 500 mg. Instructions provided for PRN use.

## 2018-05-03 NOTE — Addendum Note (Signed)
Addended by: Tawnya CrookSAMBATH, Phila Shoaf on: 05/03/2018 04:12 PM   Modules accepted: Orders

## 2018-05-21 ENCOUNTER — Telehealth: Payer: Self-pay

## 2018-05-21 NOTE — Telephone Encounter (Signed)
Left message for patient to call back in regards to a referral   

## 2018-07-20 ENCOUNTER — Ambulatory Visit (INDEPENDENT_AMBULATORY_CARE_PROVIDER_SITE_OTHER): Payer: 59 | Admitting: Internal Medicine

## 2018-07-20 ENCOUNTER — Encounter: Payer: Self-pay | Admitting: Internal Medicine

## 2018-07-20 VITALS — BP 130/86 | HR 103 | Temp 97.9°F | Wt 388.0 lb

## 2018-07-20 DIAGNOSIS — R0602 Shortness of breath: Secondary | ICD-10-CM

## 2018-07-20 DIAGNOSIS — I479 Paroxysmal tachycardia, unspecified: Secondary | ICD-10-CM | POA: Diagnosis not present

## 2018-07-20 DIAGNOSIS — R61 Generalized hyperhidrosis: Secondary | ICD-10-CM | POA: Diagnosis not present

## 2018-07-20 NOTE — Patient Instructions (Signed)
Supraventricular Tachycardia, Adult  Supraventricular tachycardia (SVT) is a kind of abnormal heartbeat. It makes your heart beat very fast and then beat at a normal speed.  A normal resting heartbeat is 60-100 times a minute. This condition can make your heart beat more than 150 times a minute. Times of having a fast heartbeat (episodes) can be scary, but they are usually not dangerous. However, in some cases, they can lead to heart failure if:  · They happen several times per day.  · Last for longer than a few seconds.  What are the causes?  Usually, a normal heartbeat starts when an area called the sinoatrial node releases an electrical signal. In SVT, other areas of the heart send out electrical signals that interfere with the signal from the sinoatrial node.  What increases the risk?  · Being 12?47 years old.  · Being a woman.  The following factors may make you more likely to develop this condition:  · Stress.  · Tiredness.  · Smoking.  · Stimulant drugs, such as cocaine and methamphetamine  · Alcohol.  · Caffeine.  · Pregnancy.  · Anxiety.  What are the signs or symptoms?  · A pounding heart.  ? A feeling that your heart is skipping beats (palpitations).  ? Weakness.  ? Trouble getting enough air.  ? Pain or tightness in your chest.  ? Feeling like you are going to pass out.  ? Feeling worried or nervous.  ? Dizziness.  ? Sweating.  ? Feeling like you might throw up.  ? Passing out (fainting).  ? Tiredness.  Sometimes, there are no symptoms.  How is this treated?  · Vagal nerve stimulation. Ways to do this:  ? Holding your breath and pushing, as though you are pooping.  ? Massaging an area on one side of your neck. Do not try this yourself. Only a doctor should do this. If done the wrong way, it can lead to a stroke.  ? Bending forward with your head between your legs.  ? Coughing while bending forward with your head between your legs.  ? Closing your eyes and massaging your eyeballs. Ask a doctor how to do  this.  · Medicines that prevent attacks.  · Medicine to stop an attack given through an IV at the hospital.  · A small electric shock (cardioversion) that stops an attack.  · Radiofrequency ablation. In this procedure, a small, thin tube (catheter) is used to send radiofrequency energy to the area that is causing the rapid heartbeats.  Follow these instructions at home:  Stress    · Avoid things that make you feel stressed.  · To deal with stress, try:  ? Doing yoga, meditation, or being out in nature.  ? Listening to relaxing music.  ? Doing deep breathing.  ? Taking steps to be healthy, such as getting lots of sleep, exercising, and eating a balanced diet.  ? Talking with a mental health doctor.  Sleep  · Try to get at least 7 hours of sleep each night.  Tobacco and nicotine    · Do not use any products that contain nicotine or tobacco, such as cigarettes, e-cigarettes, and chewing tobacco. If you need help quitting, ask your doctor.  Alcohol  · If alcohol gives you a fast heartbeat, do not drink alcohol.  · Even in alcohol does not seem to give you a fast heartbeat, limit alcohol use,.  ? For nonpregnant women, this means no more than 1   drink a day.  ? For men, this means no more than 2 drinks a day.  § "One drink" means one of these:  § 12 oz of beer (355 mL).  § 5 oz of wine (148 mL)  § 1½ oz of hard liquor (44 mL).  Caffeine  · If caffeine gives you a fast heartbeat, do not eat, drink, or use anything with caffeine in it.  · Even if caffeine does not seem to give you a fast heartbeat, limit how much caffeine you eat, drink, or use.  Stimulant drugs  · Do not use drugs such as cocaine or methamphetamine. If you need help quitting, ask your doctor.  General instructions  · Stay at a healthy weight.  · Exercise regularly. Ask your doctor about good activities for you. Try one of these:  ? 150 minutes a week of gentle exercise, like walking or yoga.  ? 75 minutes a week of exercise that is very active, like  running or swimming.  · Try a combination of gentle exercise and very active exercise.  · Do home treatments to slow down your heartbeat.  · Take over-the-counter and prescription medicines only as told by your doctor.  Contact a doctor if:  · You have a fast heartbeat more often.  · Times of having a fast heartbeat last longer than before.  · Home treatments to slow down your heartbeat do not help.  · You have new symptoms.  Get help right away if:  · You have chest pain.  · Your symptoms get worse.  · You have trouble breathing.  · Your heart beats very fast for more than 20 minutes.  · You pass out.  These symptoms may be an emergency. Do not wait to see if the symptoms will go away. Get medical help right away. Call your local emergency services (911 in the [place] Do not drive yourself to the hospital.  Summary  · SVT is a type of abnormal heart beat.  · During an episode, our heart rate may be higher than 150 beats per minute  · Treatment depends on how often the condition happens and your symptoms.  This information is not intended to replace advice given to you by your health care provider. Make sure you discuss any questions you have with your health care provider.  Document Released: 06/02/2005 Document Revised: 02/07/2016 Document Reviewed: 02/07/2016  Elsevier Interactive Patient Education © 2019 [employer] Inc[employer]

## 2018-07-20 NOTE — Progress Notes (Signed)
Subjective:    Patient ID: Brian Mullins, male    DOB: 1971-02-15, 48 y.o.   MRN: [id number]  HPI  Patient presents to the clinic today with a sudden onset of tachycardia, mild shortness of breath and sweating.  He was at work during this episode.  He reports he was drinking a Hospital doctor drink, which he normally has twice daily.  The tachycardia and shortness of breath would improve with rest and worsened with exertion.  He denied chest pain nausea or vomiting.  He reports this episode lasted for about 2 hours before it resolved.  He reports his heart rate got up to 173 bpm max.  He denies runny nose, nasal congestion, ear pain, sore throat or cough.  His mom advised him to come to the clinic and be tested for the flu.  He denies any family history of arrhythmias.  He does denies recent increase in stress.  He has not taking any stimulant medications over-the-counter.  Review of Systems      Past Medical History:  Diagnosis Date  . Morbid obesity with BMI of 50.0-59.9, adult ([place])   . Recurrent cold sores     Current Outpatient Medications  Medication Sig Dispense Refill  . ibuprofen (ADVIL,MOTRIN) 200 MG tablet Take 200 mg by mouth every 6 (six) hours as needed.    . valACYclovir (VALTREX) 500 MG tablet Take 2 tablets twice daily for 1-3 days as needed for outbreaks. 24 tablet 0   No current facility-administered medications for this visit.     No Known Allergies  History reviewed. No pertinent family history.  Social History   Socioeconomic History  . Marital status: Divorced    Spouse name: Not on file  . Number of children: Not on file  . Years of education: Not on file  . Highest education level: Not on file  Occupational History  . Not on file  Social Needs  . Financial resource strain: Not on file  . Food insecurity:    Worry: Not on file    Inability: Not on file  . Transportation needs:    Medical: Not on file    Non-medical: Not on file  Tobacco Use  .  Smoking status: Never Smoker  . Smokeless tobacco: Never Used  Substance and Sexual Activity  . Alcohol use: Yes    Alcohol/week: 0.0 standard drinks    Comment: social  . Drug use: Not on file  . Sexual activity: Not on file  Lifestyle  . Physical activity:    Days per week: Not on file    Minutes per session: Not on file  . Stress: Not on file  Relationships  . Social connections:    Talks on phone: Not on file    Gets together: Not on file    Attends religious service: Not on file    Active member of club or organization: Not on file    Attends meetings of clubs or organizations: Not on file    Relationship status: Not on file  . Intimate partner violence:    Fear of current or ex partner: Not on file    Emotionally abused: Not on file    Physically abused: Not on file    Forced sexual activity: Not on file  Other Topics Concern  . Not on file  Social History Narrative   Divorced.   2 children.    Works as an Programme researcher, broadcasting/film/video.   Enjoys golfing, spending time with family.  Constitutional: Denies fever, malaise, fatigue, headache or abrupt weight changes.  HEENT: Denies eye pain, eye redness, ear pain, ringing in the ears, wax buildup, runny nose, nasal congestion, bloody nose, or sore throat. Respiratory: Patient reports intermittent shortness of breath.  Denies difficulty breathing, cough or sputum production.   Cardiovascular: Patient reports tachycardia.  Denies chest pain, chest tightness, or swelling in the hands or feet.  Gastrointestinal: Denies abdominal pain, bloating, constipation, diarrhea or blood in the stool.  Neurological: Denies dizziness, difficulty with memory, difficulty with speech or problems with balance and coordination.    No other specific complaints in a complete review of systems (except as listed in HPI above).  Objective:   Physical Exam   BP 130/86   Pulse (!) 103   Temp 97.9 F (36.6 C) (Oral)   Wt (!) 388 lb (176 kg)   SpO2 97%    BMI 50.50 kg/m  Wt Readings from Last 3 Encounters:  07/20/18 (!) 388 lb (176 kg)  05/03/18 (!) 388 lb 8 oz (176.2 kg)  09/04/17 (!) 372 lb (168.7 kg)    General: Appears his stated age, obese, in NAD. Neck:  Neck supple, trachea midline. No masses, lumps or thyromegaly present.  Cardiovascular: Tachycardic with rhythm. S1,S2 noted.  No murmur, rubs or gallops noted.  Pulmonary/Chest: Normal effort and positive vesicular breath sounds. No respiratory distress. No wheezes, rales or ronchi noted.  Abdomen: Soft and nontender. Normal bowel sounds. No distention or masses noted. Neurological: Alert and oriented.     BMET    Component Value Date/Time   NA 138 09/04/2017 1229   K 4.3 09/04/2017 1229   CL 103 09/04/2017 1229   CO2 26 09/04/2017 1229   GLUCOSE 110 (H) 09/04/2017 1229   BUN 15 09/04/2017 1229   CREATININE 0.74 09/04/2017 1229   CALCIUM 9.4 09/04/2017 1229    Lipid Panel  No results found for: CHOL, TRIG, HDL, CHOLHDL, VLDL, LDLCALC  CBC    Component Value Date/Time   WBC 10.0 09/04/2017 1229   RBC 4.95 09/04/2017 1229   HGB 14.4 09/04/2017 1229   HCT 42.2 09/04/2017 1229   PLT 231.0 09/04/2017 1229   MCV 85.3 09/04/2017 1229   MCHC 34.1 09/04/2017 1229   RDW 12.9 09/04/2017 1229    Hgb A1C Lab Results  Component Value Date   HGBA1C 6.2 09/07/2017           Assessment & Plan:  Paroxysmal tachycardia, shortness of breath, sweats:  Concerning for paroxysmal A. fib or SVT Indication for ECG: Paroxysmal tachycardia Interpretation of ECG: Sinus tachycardia Comparison of ECG: None Likely due from excessive caffeine from monster energy drink, advised him to stop this We will check CBC, C met, magnesium and TSH today If reoccurs will send to cardiology for Holter monitor  Return precautions discussed Webb Silversmith, NP

## 2018-07-21 LAB — CBC
HCT: 43.2 % (ref 39.0–52.0)
Hemoglobin: 14.7 g/dL (ref 13.0–17.0)
MCHC: 34.1 g/dL (ref 30.0–36.0)
MCV: 85.4 fl (ref 78.0–100.0)
Platelets: 243 10*3/uL (ref 150.0–400.0)
RBC: 5.05 Mil/uL (ref 4.22–5.81)
RDW: 12.9 % (ref 11.5–15.5)
WBC: 10 10*3/uL (ref 4.0–10.5)

## 2018-07-21 LAB — COMPREHENSIVE METABOLIC PANEL
ALBUMIN: 4.3 g/dL (ref 3.5–5.2)
ALT: 25 U/L (ref 0–53)
AST: 14 U/L (ref 0–37)
Alkaline Phosphatase: 55 U/L (ref 39–117)
BUN: 21 mg/dL (ref 6–23)
CO2: 21 mEq/L (ref 19–32)
CREATININE: 0.8 mg/dL (ref 0.40–1.50)
Calcium: 9.4 mg/dL (ref 8.4–10.5)
Chloride: 105 mEq/L (ref 96–112)
GFR: 103.64 mL/min (ref >60.00)
Glucose, Bld: 135 mg/dL — ABNORMAL HIGH (ref 70–99)
Potassium: 4.2 mEq/L (ref 3.5–5.1)
Sodium: 139 mEq/L (ref 135–145)
Total Bilirubin: 0.8 mg/dL (ref 0.2–1.2)
Total Protein: 6.8 g/dL (ref 6.0–8.3)

## 2018-07-21 LAB — TSH: TSH: 1.83 u[IU]/mL (ref 0.35–4.50)

## 2018-07-21 LAB — MAGNESIUM: Magnesium: 1.9 mg/dL (ref 1.5–2.5)

## 2018-08-04 ENCOUNTER — Other Ambulatory Visit: Payer: Self-pay

## 2018-08-04 DIAGNOSIS — B001 Herpesviral vesicular dermatitis: Secondary | ICD-10-CM

## 2018-08-04 MED ORDER — VALACYCLOVIR HCL 500 MG PO TABS: ORAL_TABLET | ORAL | 0 refills | Status: DC

## 2018-08-04 NOTE — Telephone Encounter (Signed)
Last prescribed on 05/03/2018. Last office visit on 07/20/2018 with [facility] for acute. No future appointment

## 2018-08-04 NOTE — Telephone Encounter (Signed)
Noted, refill sent to pharmacy. 

## 2018-08-10 ENCOUNTER — Telehealth: Payer: Self-pay

## 2018-08-10 NOTE — Telephone Encounter (Signed)
[facility] Night - Client Nonclinical Telephone Record [facility] Medical Call Center Client [facility] Night - Client Client Site [facility] - Night Physician Vernona Rieger - NP Contact Type Call Who Is Calling Patient / Member / Family / Caregiver Caller Name [name] Caller Phone Number [phone] Patient Name Brian Mullins Patient DOB 1970-11-13 Call Type Message Only Information Provided Reason for Call Medication Question / Request Initial Comment Caller needs a refill. Additional Comment Call Closed By: Gustavus Messing Transaction Date/Time: 08/10/2018 5:11:56 AM (ET)

## 2018-08-10 NOTE — Telephone Encounter (Signed)
Unable to reach pt by phone to see what med pt needs refill.

## 2018-08-11 NOTE — Telephone Encounter (Addendum)
Message left for patient to return my call.  Also check patient's chart and we did refill valacyclovir (VALTREX) 500 mg tablet on 08/04/2018 for patient already. This is the only Rx he has on file.

## 2018-11-16 ENCOUNTER — Other Ambulatory Visit: Payer: Self-pay | Admitting: Primary Care

## 2018-11-16 DIAGNOSIS — B001 Herpesviral vesicular dermatitis: Secondary | ICD-10-CM

## 2018-11-26 ENCOUNTER — Telehealth: Payer: Self-pay

## 2018-11-26 DIAGNOSIS — B001 Herpesviral vesicular dermatitis: Secondary | ICD-10-CM

## 2018-11-26 MED ORDER — VALACYCLOVIR HCL 500 MG PO TABS: ORAL_TABLET | ORAL | 0 refills | Status: DC

## 2018-11-26 NOTE — Telephone Encounter (Signed)
Please call patient and notify him that we did not deny his Rx.  1. How often is he having cold sores? 2. If he's having them more than 4-5 times a year then we need to treat him with a better regimen.

## 2018-11-26 NOTE — Telephone Encounter (Signed)
[place] Night - Client Nonclinical Telephone Record [facility] Client [place] Night - Client Client Site [place] Physician [name] - NP Contact Type Call Who Is Calling Patient / Member / Family / Caregiver Caller Name [name] Caller Phone Number [phone] Patient Name Brian Mullins Patient DOB [birth year] Call Type Message Only Information Provided Reason for Call Medication Question / Request Initial Comment Caller states he is needing to get a prescription refill. Pharmacy called as well for a refill and got denied by Dr. [name]. Additional Comment Declined triage. Provided office hours. Call Closed By: [name] Transaction Date/Time: 11/26/2018 7:45:20 AM (ET)

## 2018-11-26 NOTE — Telephone Encounter (Signed)
Spoken and notified patient of Brian Mullins comments.   Patient stated that he does not get it that often. He stated that more like 3-4, is that okay to continue what we care doing now then.  Since the last refill, he had 1 outbreak but feeling that he is about to get another one.

## 2018-11-26 NOTE — Telephone Encounter (Signed)
Will refill. If he has more than 4 outbreaks annually then we will need to go a different direction with treatment. Have him update me if this is the case. Refill sent to pharmacy.

## 2018-11-26 NOTE — Telephone Encounter (Signed)
So someone else had denied this refill on 11/16/2018.  Ok to refill? valACYclovir (VALTREX) 500 MG tablet   Last prescribed on 08/04/2018. Last seen on 07/20/2018 with [facility] for an acute.

## 2018-11-29 NOTE — Telephone Encounter (Signed)
Spoken and notified patient of Brian Mullins's comments. Patient verbalized understanding.  

## 2019-01-31 ENCOUNTER — Other Ambulatory Visit: Payer: Self-pay | Admitting: Primary Care

## 2019-01-31 DIAGNOSIS — B001 Herpesviral vesicular dermatitis: Secondary | ICD-10-CM

## 2019-02-01 MED ORDER — VALACYCLOVIR HCL 500 MG PO TABS: ORAL_TABLET | ORAL | 0 refills | Status: AC

## 2019-02-01 NOTE — Telephone Encounter (Signed)
Pt left v/m requesting cb to understand refusal of valacyclovir med refill.

## 2019-02-01 NOTE — Telephone Encounter (Signed)
Spoken to patient and he stated that on his last refilled, he had to use all of tablet to get rid of it.  He does not have an outbreak right but feels one coming. Will refill as requested.

## 2019-02-01 NOTE — Addendum Note (Signed)
Addended by: Jacqualin Combes on: 02/01/2019 02:25 PM   Modules accepted: Orders

## 2020-09-18 NOTE — Unmapped (Signed)
AG 4.5.22, pt qualifies for bariatric surgery given BMI 46.86. pt has benefits. BMI must be 35-39.9 with 1 qualifying co morbid or 40/over. Needs 3 cont months of supervised within 12 months of submitting  Medical policy [phone]

## 2020-09-19 NOTE — Unmapped (Signed)
[facility] 09/19/20 - LVM to discuss ben and sch IA

## 2022-06-04 LAB — CBC AND DIFFERENTIAL
Basophil Abs-PLP: 0.1 x10(3)/mcL (ref 0.0–0.1)
Basophils-PLP: 0.7 %
Eosinophils Absolute: 0.2 x10(3)/mcL (ref 0.0–0.5)
Eosinophils-PLP: 3.6 %
Hematocrit: 40.2 % (ref 40.0–51.0)
Hemoglobin: 13.2 g/dL — ABNORMAL LOW (ref 13.7–17.5)
Immature Gran Abs-PLP: 0 x10(3)/mcL (ref 0.0–0.1)
Immature Grans-PLP: 0.1 %
Lymphocytes Abs-PLP: 2.2 x10(3)/mcL (ref 1.2–3.9)
Lymphocytes-PLP: 32.4 %
MCH: 30.6 pg (ref 26.0–34.0)
MCHC: 32.8 g/dL (ref 30.7–35.5)
MCV: 93.1 fL (ref 80.0–100.0)
MPV: 10.3 fL (ref 8.8–12.5)
Monocytes Abs-PLP: 0.4 x10(3)/mcL (ref 0.3–0.9)
Monocytes-PLP: 6.1 %
Neutrophil-PLP: 57.1 %
Neutrophils Abs-PLP: 3.8 x10(3)/mcL (ref 1.6–6.1)
Platelets: 188 x10(3)/mcL (ref 155–369)
RBC: 4.32 x10(6)/mcL — ABNORMAL LOW (ref 4.60–6.10)
RDW: 12 % (ref <=14.9)
WBC: 6.7 x10(3)/mcL (ref 3.7–10.3)

## 2022-06-04 LAB — VITAMIN B12: Vitamin B-12: 646 pg/mL (ref 232–1245)

## 2022-06-04 LAB — COMPREHENSIVE METABOLIC PANEL
ALT: 10 [IU]/L (ref 10–60)
AST: 12 [IU]/L (ref 10–40)
Albumin: 4.1 g/dL (ref 3.5–5.7)
Alkaline Phosphatase: 43 [IU]/L (ref 35–135)
Anion Gap: 7 mmol/L (ref 4–16)
BUN: 15 mg/dL (ref 8–26)
CO2: 29 mmol/L (ref 21–31)
Calcium: 9.2 mg/dL (ref 8.5–10.4)
Chloride: 104 meq/L (ref 98–111)
Creatinine: 0.62 mg/dL — ABNORMAL LOW (ref 0.70–1.30)
EGFR: 116 mL/min/1.73 m2 (ref >59)
Glucose: 93 mg/dL (ref 70–99)
Potassium, Bld: 3.8 meq/L (ref 3.6–5.1)
Sodium: 140 meq/L (ref 135–145)
Total Bilirubin: 1.5 mg/dL — ABNORMAL HIGH (ref 0.0–1.2)
Total Protein: 6.3 g/dL (ref 6.0–8.0)

## 2022-06-04 LAB — LIPID PANEL
Cholesterol, Total: 138 mg/dL (ref <200)
HDL: 57 mg/dL (ref >=40)
LDL Calculated: 69 mg/dL (ref <100)
Non-HDL Cholesterol, Calculated: 81 mg/dL (ref <=129)
Triglycerides: 58 mg/dL (ref <150)

## 2022-06-04 LAB — FOLATE: Folic Acid: >16 ng/mL (ref >=4.50)

## 2022-06-04 LAB — COPPER, SERUM: Copper: 72.1 ug/dL (ref 70.0–140.0)

## 2022-06-04 LAB — HEMOGLOBIN A1C
Estimated Average Glucose: 88 mg/dL
Hemoglobin A1C: 4.7 % (ref 4.2–5.6)

## 2022-06-04 LAB — VITAMIN B1, WHOLE BLOOD: Vit. B1, Whole Blood: 225 nmol/L — ABNORMAL HIGH (ref 70–180)

## 2022-06-04 LAB — ZINC: Zinc: 69.2 ug/dL (ref 60.0–120.0)

## 2022-06-04 LAB — IRON: Iron: 96 ug/dL (ref 50–170)

## 2022-06-04 LAB — VITAMIN A
Interpretation (Vit. A): NORMAL
Retinyl Palmitate: 0.04 mg/L (ref 0.00–0.10)
Vitamin A: 0.7 mg/L (ref 0.30–1.20)

## 2022-06-04 LAB — VITAMIN D 25 HYDROXY: Vit D, 25-Hydroxy: 69.8 ng/mL (ref 30.0–150.0)

## 2022-06-04 LAB — MAGNESIUM: Magnesium: 1.7 mg/dL (ref 1.7–2.4)

## 2022-06-04 LAB — PTH, INTACT: PTH: 22.4 pg/mL (ref 15.00–65.00)
